# Patient Record
Sex: Female | Born: 2005 | Race: Black or African American | Hispanic: No | Marital: Single | Smoking: Never smoker
Health system: Southern US, Community
[De-identification: ages and names within clinical notes are randomized; demographics above are authoritative.]

---
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
## 2024-01-21 ENCOUNTER — Encounter: Payer: Self-pay | Admitting: Family Medicine

## 2024-01-21 ENCOUNTER — Ambulatory Visit

## 2024-05-08 ENCOUNTER — Emergency Department (HOSPITAL_COMMUNITY)

## 2024-05-08 ENCOUNTER — Encounter (HOSPITAL_COMMUNITY): Payer: Self-pay

## 2024-05-08 ENCOUNTER — Other Ambulatory Visit: Payer: Self-pay

## 2024-05-08 ENCOUNTER — Emergency Department (HOSPITAL_COMMUNITY)
Admission: EM | Admit: 2024-05-08 | Discharge: 2024-05-08 | Disposition: A | Discharge status: Home / Self Care | Attending: Emergency Medicine | Admitting: Emergency Medicine

## 2024-05-08 DIAGNOSIS — Y92410 Unspecified street and highway as the place of occurrence of the external cause: Secondary | ICD-10-CM | POA: Diagnosis not present

## 2024-05-08 DIAGNOSIS — M546 Pain in thoracic spine: Secondary | ICD-10-CM | POA: Insufficient documentation

## 2024-05-08 LAB — POC URINE PREG, ED: Preg Test, Ur: NEGATIVE

## 2024-05-08 MED ORDER — METHOCARBAMOL 500 MG PO TABS
500.0000 mg | ORAL_TABLET | Freq: Once | ORAL | Status: AC
  Administered 2024-05-08: 500 mg via ORAL
  Filled 2024-05-08: qty 1

## 2024-05-08 MED ORDER — ACETAMINOPHEN 500 MG PO TABS
1000.0000 mg | ORAL_TABLET | Freq: Once | ORAL | Status: AC
  Administered 2024-05-08: 1000 mg via ORAL
  Filled 2024-05-08: qty 2

## 2024-05-08 MED ORDER — KETOROLAC TROMETHAMINE 15 MG/ML IJ SOLN
15.0000 mg | Freq: Once | INTRAMUSCULAR | Status: AC
  Administered 2024-05-08: 15 mg via INTRAMUSCULAR
  Filled 2024-05-08: qty 1
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
## 2024-05-08 NOTE — ED Notes (Signed)
 Patient is having back pain and is concerned she hasn't had a x-ray. EDP notified.
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
## 2024-05-08 NOTE — ED Notes (Signed)
 Patient transported to CT

## 2024-06-10 ENCOUNTER — Ambulatory Visit
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
## 2024-06-12 ENCOUNTER — Ambulatory Visit: Admitting: Family Medicine

## 2024-06-12 VITALS — BP 106/66 | HR 95 | Temp 98.2°F | Ht 61.0 in | Wt 149.2 lb

## 2024-06-12 DIAGNOSIS — Z3201 Encounter for pregnancy test, result positive: Secondary | ICD-10-CM

## 2024-06-12 LAB — POCT URINE PREGNANCY: Preg Test, Ur: POSITIVE — AB

## 2024-06-12 MED ORDER — PRENATAL VITAMIN AND MINERAL 28-0.8 MG PO TABS: 1.0000 | ORAL_TABLET | Freq: Every day | ORAL | 3 refills | Status: DC

## 2024-06-25 ENCOUNTER — Telehealth: Payer: Self-pay | Admitting: Family Medicine

## 2024-06-25 ENCOUNTER — Other Ambulatory Visit: Payer: Self-pay | Admitting: Family Medicine

## 2024-06-25 DIAGNOSIS — Z3201 Encounter for pregnancy test, result positive: Secondary | ICD-10-CM

## 2024-06-25 NOTE — Telephone Encounter (Signed)
 Order needs to be OB less than 14 weeks, Transvaginal

## 2024-06-25 NOTE — Telephone Encounter (Signed)
 Melissa from [facility] [phone]  Needs a call back to change ultrasound. There is a crm about this.

## 2024-06-25 NOTE — Progress Notes (Signed)
 Updated order for ultrasound to be OB < 14 weeks.

## 2024-06-25 NOTE — Telephone Encounter (Signed)
 Spoke with PCP order has been changed.

## 2024-06-26 ENCOUNTER — Ambulatory Visit (HOSPITAL_COMMUNITY): Admission: RE | Admit: 2024-06-26 | Discharge: 2024-06-26 | Attending: Family Medicine

## 2024-06-26 DIAGNOSIS — Z3201 Encounter for pregnancy test, result positive: Secondary | ICD-10-CM | POA: Insufficient documentation

## 2024-06-30 ENCOUNTER — Ambulatory Visit: Payer: Self-pay | Admitting: Family Medicine

## 2024-07-21 NOTE — Progress Notes (Unsigned)
 New OB Intake  I connected with Natasha Bell  on 07/21/2024 at  8:15 AM EST by MyChart Video Visit and verified that I am speaking with the correct person using two identifiers. Nurse is located at [facility] and pt is located at home.  I discussed the limitations, risks, security and privacy concerns of performing an evaluation and management service by telephone and the availability of in person appointments. I also discussed with the patient that there may be a patient responsible charge related to this service. The patient expressed understanding and agreed to proceed.  I explained I am completing New OB Intake today. We discussed EDD of 02/03/2024 based on US  at 8.3 weeks. Pt is G1P0. I reviewed her allergies, medications and Medical/Surgical/OB history.    There are no active problems to display for this patient.    Concerns addressed today  Delivery Plans Plans to deliver at [facility]. Discussed the nature of our practice with multiple providers including residents and students as well as female and female providers. Due to the size of the practice, the delivering provider may not be the same as those providing prenatal care.   Patient {Is/is not:9024} interested in water birth.  MyChart/Babyscripts MyChart access verified. I explained pt will have some visits in office and some virtually. Babyscripts instructions given and order placed. Patient verifies receipt of registration text/e-mail. Account successfully created and app downloaded. If patient is a candidate for Optimized scheduling, add to sticky note.   Blood Pressure Cuff/Weight Scale Blood pressure cuff ordered for patient to pick-up from [employer]. Explained after first prenatal appt pt will check weekly and document in Babyscripts.  Anatomy US  Explained first scheduled US  will be around 19 weeks. Anatomy US  scheduled for *** at ***.  Is patient a CenteringPregnancy candidate?  {Accepted:19197::Accepted,Not  a Candidate,Declined} Declined due to {Declined:19197::Schedule,Childcare,Group setting,Support person concern,Declined to say,Enrolled in [facility],***} Not a candidate due to {Not a Candidate:19197::DM,CHTN, medication controlled,Language barrier,>28 weeks,Multiple gestation (mono-mono or mono-di),Complex coordination of care needed,***} If accepted,    Is patient a Mom+Baby Combined Care candidate?  {Accepted:19197::Accepted,Declined,Not a candidate,***}   If accepted, confirm patient does not intend to move from the area for at least 12 months, then notify Mom+Baby staff  Is patient a candidate for Babyscripts Optimization? {babyscripts:31704}   First visit review I reviewed new OB appt with patient. Explained pt will be seen by Jorene Moats, PA at first visit. Discussed [place] genetic screening with patient. [employer] and [employer].. Routine prenatal labs is needed at [facility] appointment.  Last Pap No results found for: DIAGPAP  Irene ONEIDA Lee, CMA 07/21/2024  4:53 PM

## 2024-07-22 ENCOUNTER — Telehealth

## 2024-07-22 DIAGNOSIS — Z3490 Encounter for supervision of normal pregnancy, unspecified, unspecified trimester: Secondary | ICD-10-CM | POA: Insufficient documentation

## 2024-07-22 DIAGNOSIS — Z3A12 12 weeks gestation of pregnancy: Secondary | ICD-10-CM

## 2024-07-22 DIAGNOSIS — Z3491 Encounter for supervision of normal pregnancy, unspecified, first trimester: Secondary | ICD-10-CM

## 2024-07-22 MED ORDER — BLOOD PRESSURE KIT DEVI: 1.0000 | 0 refills | Status: AC | PRN

## 2024-07-22 NOTE — Patient Instructions (Signed)
 Safe Medications in Pregnancy   Acne:  Benzoyl Peroxide  Salicylic Acid   Backache/Headache:  Tylenol: 2 regular strength every 4 hours OR               2 Extra strength every 6 hours   Colds/Coughs/Allergies:  Benadryl (alcohol free) 25 mg every 6 hours as needed  Breath right strips  Claritin  Cepacol throat lozenges  Chloraseptic throat spray  Cold-Eeze- up to three times per day  Cough drops, alcohol free  Flonase (by prescription only)  Guaifenesin  Mucinex  Robitussin DM (plain only, alcohol free)  Saline nasal spray/drops  Sudafed (pseudoephedrine) & Actifed * use only after [redacted] weeks gestation and if you do not have high blood pressure  Tylenol  Vicks Vaporub  Zinc lozenges  Zyrtec   Constipation:  Colace  Ducolax suppositories  Fleet enema  Glycerin suppositories  Metamucil  Milk of magnesia  Miralax  Senokot  Smooth move tea   Diarrhea:  Kaopectate  Imodium A-D   *NO pepto Bismol   Hemorrhoids:  Anusol  Anusol HC  Preparation H  Tucks   Indigestion:  Tums  Maalox  Mylanta  Zantac  Pepcid   Insomnia:  Benadryl (alcohol free) 25mg  every 6 hours as needed  Tylenol PM  Unisom, no Gelcaps   Leg Cramps:  Tums  MagGel   Nausea/Vomiting:  Bonine  Dramamine  Emetrol  Ginger extract  Sea bands  Meclizine  Nausea medication to take during pregnancy:  Unisom (doxylamine succinate 25 mg tablets) Take one tablet daily at bedtime. If symptoms are not adequately controlled, the dose can be increased to a maximum recommended dose of two tablets daily (1/2 tablet in the morning, 1/2 tablet mid-afternoon and one at bedtime).  Vitamin B6 100mg  tablets. Take one tablet twice a day (up to 200 mg per day).   Skin Rashes:  Aveeno products  Benadryl cream or 25mg  every 6 hours as needed  Calamine Lotion  1% cortisone cream   Yeast infection:  Gyne-lotrimin 7  Monistat 7    **If taking multiple medications, please check labels to avoid  duplicating the same active ingredients  **take medication as directed on the label  ** Do not exceed 4000 mg of tylenol in 24 hours  **Do not take medications that contain aspirin or ibuprofen           [facility] Pediatric Providers  [place] ([address]) [facility] Manson Passey, MD; Deirdre Priest, MD; Lum Babe, MD; Leveda Anna, MD; McDiarmid, MD; Jerene Bears, MD [address] [place], [place] [address] [phone] Mon-Fri 8:30-12:30, 1:30-5:00  Providers come to see babies during newborn hospitalization Only accepting infants of Mother's who are seen at [facility] or have siblings seen at   [facility] Medicaid - Yes; Tricare - Yes   [facility], MD [address] [place], [place] [address] [phone] Mon, Tue, Thur, Fri 8:30-5:00, Wed 10:00-7:00 (closed 1-2pm daily for lunch) [facility] residents with no insurance.  [place] [employer] only with Medicaid/insurance; Tricare - no  [facility]) - [facility], MD; Manson Passey, MD; Ave Filter, MD; Luna Fuse, MD; Kennedy Bucker, MD; Florestine Avers, MD; Melchor Amour, MD; Yetta Barre,  MD; Konrad Dolores, MD; Kathlene November, MD; Jenne Campus, MD; Wynetta Emery, MD; Duffy Rhody, MD; Gerre Couch, NP [address], [place], [place] [address] [phone] Mon, Tue, Thur, Fri 8:30-5:30, Wed 9:30-5:30, Sat 8:30-12:30 Only accepting infants of first-time parents or siblings of current patients  Hospital discharge coordinator will make follow-up appointment Medicaid - yes; Tricare - yes  [place] ([address]) [facility], MD; Earlene Plater, MD; Jamesetta Orleans, MD; Alvera Novel, MD; Rana Snare, MD; [facility], MD; [place], MD; Hosie Poisson, MD; Mayford Knife, MD [address], [place], [place] [address] [phone] Mon-Fri 8:30-5:00, closed for lunch 12:30-1:30; Sat-Sun 10:00-1:00 Accepting Newborns with commercial insurance only, must call prior to  delivery to be accepted into  practice.  Medicaid - no, Tricare - yes   [facility] [address] [place], [place] [address] [phone] or [phone] Mon to Fri 8am to 10pm, Sat 8am to 1pm (virtual only on weekends) Only accepts Medicaid Healthy Blue pts  [facility] ([facility]) - Pediatrics at Elige Radon, MD; Sabino Dick, MD; Quitman Livings, MD; Betha Loa, NP; Claretha Cooper, MD; Lelon Perla, MD [address] [place] [address] [place], [place] [address] [phone] Mon-Fri 8:30-5:30 Medicaid - yes, Tricare - yes  [place] [phone]) [facility] Mowers, MD [address], [place], [place] [address] [phone] Iona Hansen, Wed Fri 8:30-5:00, Sat 8:30-12:00, Closed Thursdays Accepting siblings of established patients and first time mom's if you call prenatally Medicaid- yes; Tricare - yes  [facility] Lutricia [place], [place]; Tracie Harrier, MD; Rusty Aus; [place], PA; Wynelle Link, MD; Azucena Cecil, MD;  [address] [place], [place] [address] [phone] Mon-Fri 8:30-5:00, closed for lunch 1-2 Only accepting newborns of established patients Medicaid- no; Tricare - yes  [facility] [phone]) [facility] Morene Crocker, MD; [address], [place], [place] [address] [phone] Mon-Fri 8:00-5:00 Medicaid - No; Tricare - Yes  [facility] at [facility], [place]; [place], [place] [address], [place], [place] [address] [phone] Mon-Fri 8:00-5:00 Medicaid - No, Tricare - Yes  [facility] Cardell Peach, MD; Nash Dimmer, MD; [place], [place] [address] [place], [place] [address] [phone]  Mon-Fri 8:00-5:00 Medicaid - No; Tricare - Yes  [facility] [address] [place], [place] [address] [phone] Mon-Fri 8:30-5:00 (lunch 12:00-1:00) Medicaid -Yes; Tricare - Yes  [facility] [place], MD [address] [place],  [place], [place] [address] [phone] Mon-Fri 8:00-5:00 Seeing newborns of current patients only. No new patients Medicaid - No, Tricare - yes  Nature conservation officer at [place] [address], MD [address] [place], [place] [address] [phone] Mon-Fri 8:00-5:00 Medicaid -yes as secondary coverage only; Tricare - yes  [facility] [place], [place]; [place], [place]; Avis Epley, MD; Vonna Kotyk, MD; Clance Boll, MD; [place], [place]; [place], NP; Vaughan Basta, MD; [place], MD [address] [place], [place] [address] [phone] Mon-Fri 8:30-5:00, Sat 9:00-11:00 Accepts commercial insurance ONLY. Offers free prenatal information sessions for families. Medicaid - No, Tricare - Call first  [facility], MD; [place], [place]; [place], [place]; [place], [place] [address] [place] [address] [phone] Mon-Fri 7:30-5:30 Medicaid - Yes; Ailene Rud yes  [place] [phone] & [phone])  [facility], MD [address] [place], [place] [address] [phone] Mon-Thur 8:00-6:00, closed for lunch 12-2, closed Fridays Medicaid - yes; Tricare - no  [facility] Dareen Piano, NP; Cyndia Bent, MD; [place], [place]; [place], [place] [address], [place], [place] [address] [phone] Mon-Fri 7:30-4:30 Medicaid - yes, Tricare - yes  [facility]  Juanito Doom, MD; Janene Harvey, NP; Vonita Moss, MD; Donn Pierini, NP [address] [place] [address], [place], [place] [address] [phone] Mon-Fri 8:30-5:00, closed for lunch 1-2, Sat 8:30-12:00 - sick visits only Providers come to see babies  at [facility] Only accepting newborns of siblings and first time parents ONLY if who have met with office prior to delivery Medicaid -Yes; Tricare - yes  [facility] - [place], [place]; Spero Geralds, NP; Earlene Plater, MD; Lucretia Roers, MD:  [address], [place], [place] [address] [phone] Mon- Fri 8:00-5:00, Sat 9:00-12:00 - sick  visits only Accepting siblings of established patients and first time mom/baby Medicaid - Yes; Tricare - yes Patients must have vaccinations (baby vaccines)  [place] [phone] & [phone])  Adult nurse HealthCare at [facility] [address] [place], [place] [address] [phone] Mon-Fri 8:00-5:00 Medicaid - no; Tricare - yes  [facility] [place], MD; [place], [place]; [place], [place] [address], [place], [place] [address] [phone] Mon-Fri 8:00-5:00 Medicaid- yes; Tricare - yes  [facility] - Ardeen Jourdain, MD; Yetta Barre, NP; [place], [place] [address] [place], [place], [place] [address] [phone] Mon-Fri 8:00-5:00 Medicaid - Yes; Tricare - yes  [address] [place] [phone])  [facility] [place], [place]; [place], [place]; Eddie Candle, MD; Normand Sloop, MD; [place], NP; [place], DO; [place], [place]; Constance Goltz, MD; Ruthann Cancer, MD; Vear Clock, MD; [place], [place]; [place], [place]; [place], [place] [address] [facility] [address], [place], [place] [address] [phone] Mon-Fri 8:30-5:00, Sat 9:00-12:00 - sick only Please register online [email or web address] then schedule online or call office Medicaid-Yes; Tricare -yes  [facility] - Premier  [place], MD; Romualdo Bolk, MD; [place], MD; [place], NP; [place], [place]; Antonietta Barcelona, MD; Mayford Knife, NP; Shelva Majestic, MD [address], [place], [place] [address] [phone] Mon-Fri 8:00-5:30, Sat&Sun by appointment (phones open at 8:30) Medicaid - Yes; Tricare - yes  [place] [phone] & [phone]) [facility] Mariel Aloe; [place], MD; Roger Shelter, MD; Arvilla Market, NP; [place], DO [address], [place], [place] [address] [phone] M-F 8:00 - 5:15, Sat/Sun 9-12 sick visits only Medicaid - No; Tricare - yes  [facility]  [place], PA-C; [place], PA-C; [place], DO; [place], PA-C; [place], PA-C; Roselyn Bering, MD [address] [place], [place] [address] [phone] Mon-Thur 8:00-7:00, Fri 8:00-5:00 Accepting Medicaid for 13 and under only   [facility] - Family Medicine at [place] [facility] [place]) [place], [place]; List, FNP; Berneda Rose, MD; Luther Redo, PA-C; Lavonia Drafts, MD; Kellie Simmering, FNP; Genevie Cheshire, FNP; Evaristo Bury, MD; Berneda Rose, MD [phone] [address] [place], [place] [address] [phone] Mon-Fri 8:30-5:30 Medicaid - Yes; Tricare - yes  [facility] - [address]  [place], ; Whitney Post, MD; Hennie Duos, MD; Wynne Dust, MD; [place], NP [address], [place], [place] [address] [phone] Mon-Thur 8:00-5:30, Fri 8:00-5:00, Sat 9:00-12:00 Medicaid - yes, Tricare - yes  [place] [phone])  [facility], [place]; Lenise Arena, MD; [place], [place] [address], [place], [place] [address] [phone] Mon-Fri 8:00-5:00, closed for lunch 12-1 Medicaid - No; Tricare - yes  Nature conservation officer at [facility], MD [address] [place], [place] [address] [phone] Mon-Fri 8:00-5:00 Medicaid - No; Tricare - yes  [facility], MD; Tami Ribas, MD; Mariam Dollar, MD; Yetta Barre, MD [address] [facility] [address], [place], [place] [address] [phone] Mon-Fri 8:00-5:00 Medicaid- Yes; Tricare - yes  [place] [phone])  Adult nurse HealthCare at [facility], [place]; [place], MD [address] [place], [place], [place] [address] [phone]  Mon-Fri 8:00-5:00 Medicaid - No; Tricare - yes  [facility] - [facility] - CPNP [address] [place], [place] [address] [phone] Mon-Weds 8:00-6:00, Thurs-Fri 8:00-5:00, Sat 9:00-12:00 Medicaid - yes; Tricare - yes   [facility] Katharina Caper, MD; [place], [place] [address] [place], [place] [address] [phone] Mon-Fri 8:00-5:00 Medicaid - yes; Tricare - yes  [facility]  Pediatric Providers  [facility] [address], [place], [place] [address] [phone] Sheral Flow: 8am -8pm, Tues, Weds: 8am - 5pm; Fri: 8-1 Medicaid - Yes; Tricare - yes  [facility] Rachel Bo, MD; Laural Benes, MD; Anner Crete, MD; [place], [place]; [place], [place] [address] [place], [place] [address] [phone] M-F 8:30 - 5:00 Medicaid - Call office; Tricare -yes  [facility] Edson Snowball, MD; Shanon Rosser, MD, Chelsea Primus, MD; Shirlyn Goltz, PNP; Wardell Heath, NP [phone] [address] [place], [place] [address] [phone] M-F 8:30 - 5:00, Sat/Sun 8:30 - 12:30 (sick visits) Medicaid - Call office; Tricare -yes  [facility] Melvyn Neth, MD; Karl Luke, PNP; Princess Bruins, MD; [place], [place]; [place], NP; Cynda Familia [address], [place], [place] [address] [phone] M-F 8:30 - 5:00 Medicaid - Call office; Tricare - yes  [facility] Jesusita Oka, MD; Dierdre Highman, MD; Earnest Conroy, MD; Timothy Lasso, MD; [place], MD [phone] [address] [place], [place] [address] [phone] M-Thur: 8:00 - 5:00; Fri: 8:00 - 4:00 Medicaid - yes; Tricare - yes  [facility] [address] [place], [place] [address] [phone] M-F: 8:30- 5:00, closed for lunch 12:30 - 1:00 Medicaid - yes; Tricare -yes  [facility] [address] [place], [place] [address] [phone] M-F 8:00 - 5:00 Medicaid - yes; Tricare - yes  [facility] [place], DO; [place], DO; [place], NP [address], [place], [place] [address] [phone] M-F 8:00 - 5:00, Closed 12-1 for lunch Medicaid - Call; Tricare - yes  [facility] Meredith Mody, MD [address], [place], [place] [address] [phone] M-F: 8:00-5:00, Sat: 8:00 - noon Medicaid - call; Tricare -yes  [facility] Pediatric Providers  [facility] [place], [place] [address] [place], [place], [place] [address] [phone] M-W: 8:00-5:00, Thur: 8:00 -  7:00, Fri: 8:00 - noon Medicaid - yes; Tricare - yes  [facility] - Quay Burow, FNP [address], [place], [place] [address] [phone] M-F 8:00 - 5:00, Closed for lunch 12-1 Medicaid - yes; Tricare - yes  [facility] Pediatric Providers  [facility] Primary Care at [place], [place], Alinda Money, MD, [place], FNP-C [address] [facility], [address], [place], [place] [address] [phone] M-T 8:00-5:00, Wed-Fri 7:00-6:00 Medicaid - Yes; Tricare -yes  [facility] Family Medicine at [facility], DO; [address] [place], [place] [address] [phone] M-F 8:00 - 5:00, closed for lunch 12-1 Medicaid - Yes; Tricare - yes  [facility] Pediatrics and Internal Medicine  Zachery Dauer, MD; Gladstone Lighter, MD; Collie Siad, MD; Freda Jackson, MD; Rich Number, MD; Darryl Nestle, MD; Melinda Crutch, MD, Audria Nine, MD; Tawanna Cooler, MD; Steffanie Dunn, MD; Byrd Hesselbach, MD; Lucretia Roers, MD [address], [place], [place] [address] [phone] M-F 8:00-5:00 Medicaid - yes; Tricare - yes  [facility] [place], MD (speaks [place] and Hindi) [address] [place], [place] [address] [phone] M-F: 8:30 - 5:00, closed 12:30 - 1 for lunch Medicaid - Yes; Tricare -yes  [facility] Pediatric Providers  [facility] Shanda Bumps, MD; Chanetta Marshall, MD; Laurell Josephs, MD  [address] [place], [place] [address] [phone] M-Th: 8:00 - 5:30, Fri: 8:00 - 12:00 Medicaid - yes; Tricare - yes  [facility]s at [facility], NP; Thora Lance, MD; Orrin Brigham, MD [phone] [address] [place], [place] [address] [phone] M-F: 8:00 - 5:00 Medicaid - yes; Tricare - yes  [facility] Hopkins, NP; Orson Slick, NP; Salley Scarlet, NP; Linton Flemings, MD; Mayford Knife, MD, [place]ikes, NP, Emelda Fear, MD; Nida Boatman [address] [place], [place] [address] [phone] M-F: 8:30 - 5:30p Medicaid - yes; Tricare - yes Other locations available as well  [facility], MD; Andrey Campanile, MD; Neville Route, PA-C [address] [place], [place] [address] [phone] M-W: 8:00am - 7:00pm, Thurs: 8:00am - 8:00pm; Fri: 8:00am - 5:00pm, closed daily from 12-1 for lunch Medicaid - yes; Tricare - yes  [facility] Pediatric Providers  [facility] at Levin Erp, MD; Aggie Cosier, FNP; Bland Span, MD; Tristan Schroeder, MD; [place], PNP; Alesia Banda; [place], [place]; Julian Reil, MD;  [address] [place], [place] [address] [phone] Judie Petit - Caleen Essex: 8am - 5pm, Sat 9-noon Medicaid - Yes; Tricare -yes  [facility] at Jaclynn Guarneri, MD; Yetta Barre, FNP; Lilian Kapur, MD; Mariam Dollar, MD [address] [place] Schlatter, UX[address]355 [phone] M-F 8:00 - 5:00 Medicaid - call; Tricare - yes  [facility]- Cruz Condon, MD; [place], [place]; Delora Fuel, MD; Dareen Piano, MD; Trudee Grip, MD; Kizzie Ide, MD; Zebedee Iba; Birdena Crandall, MD; Hinton Dyer, MD; [place], MD [address] [place], [place] [address] [phone] M-F 8:00am - 5:00pm; Sat. 9:00 - 11:00 Medicaid - yes; Tricare - yes  [facility] at [facility], MD [address] [place], [place] [address] [phone] M-F 8:00 - 5:00 Medicaid - [place] Medicaid only; Tricare - yes  [facility] - Illene Bolus, MD; Earlene Plater, [place]; Kenyon Ana, MD [address] [place], [place] [address] [phone] M-F 8:00 - 5:00 Medicaid - yes; Tricare - yes  [facility] [address] Pediatrics - Lind Covert, MD; Manson Passey, MD, [facility], MD, [place], MD; [place], MD; Katrinka Blazing, MD; [address] [place], [place] [address] [phone] M-F: 8-5 Medicaid - yes; Tricare - yes  [facility] - Henrietta Hoover, [place]; [place], MD; [address] [place], [place] [address] [phone] M-F 8-5 Medicaid - yes; Tricare - yes  [address] [place] Darrol Poke, MD; Tami Ribas, MD; [place], MD; [place], DNP;  [place], PNP [address], [place], [place] [address] [phone] M-F 8-5 Medicaid - yes; Tricare - yes  [facility] and Pediatrics Delories Heinz, MD; Adrienne Mocha; Ala Bent, MD [address], [place], [place] [address] [phone] M-F 7am - 5 pm Medicaid - call; Tricare - yes  [facility] [place], [place]; Fredia Beets, MD; Roxan Hockey, MD [address] [place], [place] [address] [phone] M-F 8-5 Medicaid - yes; Tricare - yes  [facility] Kae Heller, MD; Sheliah Hatch, MD; Mayford Knife, FNP; Shon Baton, FNP; Tyron Russell, FNP; Ishmael Holter; [facility] - FNP [address] [place], [place] [address] [phone] M-F 8-5 Medicaid- yes; Tricare - yes  [facility]s - Betsy Coder, [place] and [place] Guest, MD; Terrial Rhodes, MD; Hulda Humphrey, MD; Roseanne Reno, MD; [place], ; Ala Dach, MD; Fredia Beets, MD; Dimple Casey, MD [address], [place], [place] [address] [phone] M-F: 8-5, Sat: 9-4, Sun 9-12 Medicaid - yes; Tricare - yes  [facility] [place], PNP; [place], PNP [address] [place], [place], [place]  [address] [phone] M-F 8 - 5, closed 12-1 for lunch Medicaid - yes; Tricare - yes  [facility] Kathyrn Lass, MD; Hal Neer, MD; Dimple Casey, MD; [place], DO [address] [place], [place] [address] [phone] M-F 8- 5:30 Medicaid - yes; Tricare - yes  Darnelle [facility] - Biagio Quint, MD; Rosalia Hammers, MD; Gwenith Daily, MD [address] [place], [place] [address] [phone] Judie Petit: Nicholas Lose; Tues-Fri: 8-5; Sat: 9-12 Medicaid - yes; Tricare - yes  Darnelle [facility] - Bobbye [place], MD; Daphane Shepherd, MD; Chestine Spore, MD; Haskell Riling, MD; Kate Sable, MD [address], [place], [place] [address] [phone] Judie PetitMarland Kitchen Nicholas LoseFrancee Nodal: 8-5; Sat: 8:30-12:30 Medicaid - yes;  Tricare - yes  [facility]lena Heckle [facility] Pediatrics - Beckey Rutter, MD; [place], [place] [address], [place], [place] [address] [phone] Mon-Fri: 8-5 Medicaid - yes; Tricare - yes  [facility]arnelle [facility] - [place], CPNP; [place], [place]; Dimple Casey, MD; Alisa Graff, MD; Cephus Shelling, MD; [address] [place], [place] [address] [phone] M-F: 8-5, closed 1-2 for lunch Medicaid - yes; Tricare - yes  [facility] - [facility] [place], [place]; [place], [place]; Katrinka Blazing, MD; [place], CPNP; [place], [place]; [place], MD; Earlene Plater, MD [address], [place], [place] [address] [phone] M-Thurs: Nicholas Lose; Fri: 8-6; Sat: 9-12; Sun 2-4 Medicaid - yes; Tricare - yes  Darnelle Bos [facility] [address] [facility] Georgeanna Lea, MD; Evette Cristal, MD; Shea Stakes, FNP; Earney Mallet, DO; [address] [place], [place] [address] [phone] M-F: 8-5 Medicaid - yes; Tricare - yes  [facility] Pediatric Providers  [facility] Medicine -Collene Mares, MD; [place], NP [address] [place], [place] [address] [phone] M - Fri: 8am - 5pm, closed for lunch 12-1 Medicaid - Yes; Tricare - yes  [facility] Elinor Parkinson, MD; Victory Dakin, MD; [place]anger, DO; Vinocur, MD;Hall, PA; Clent Ridges, [place]; Orvan Falconer, NP [phone] [address] [place], [place] [address] [phone] M-F 8:00 - 5:00, Sat 8:00 - 11:30 Medicaid - yes; Tricare - yes  [facility] Welton Flakes, MD; [place], MD, [address] MD, [place], MD, Malone, MD; [place], NP; [place], [place];  [address], [place], [place] [address] [phone] M-F 8:10am - 5:00pm Medicaid - yes; Tricare - yes  [facility] Eustaquio Boyden, MD; [place], NP [address] [place], [place] [address] [phone] M-F 8:00 - 5:00 Medicaid - [place] Medicaid only; Tricare - yes  [facility] [address] Bonadelle Ranchos, MD; [place], NP [address], [place], [place] [address] [phone] M-F 8:00 - 5:00; Closed for lunch 12 - 1:00 Medicaid - yes; Tricare - yes  [facility] Belva Crome, MD; Jonita Albee, FNP [address] [place], [place] [address] [phone] Mon 9-5; Tues/Wed 10-5; Thurs 8:30-5; Fri: 8-12:30 Medicaid - yes; Tricare - yes  [facility] Pediatric Providers  [facility]  Stamps, MD; [place], [place] [address] [place], [place] [address] [phone] phone [phone] fax M-F 7:15 - 4:30 Medicaid - yes; Tricare - yes  [place] [facility] Karilyn Cota, MD; [place], DO [address], [place], [place] [address] [phone] M-Fri: 8:30 - 5:00, closed for lunch everyday noon - 1pm Medicaid - Yes; Tricare - yes  [facility] Burdine, MD; Reuel Boom, MD; Dimas Aguas, MD; Neita Carp, MD; [place], [place]; Bonnita Nasuti, [place]; [place], [place]; [place], [place];  [place], PA [address] [place], [place] [address] [phone] M-Thurs: 7:30am - 7:00pm; Friday 7:30am - 4pm; Sat: 8:00 - 1:00 Medicaid - Yes; Tricare - yes  [place] [facility] Morton, MD; Conni Elliot, MD; Carroll Kinds, MD; [place], DO [address], [place], [place] [address] [phone] M-Thur: 8:00 - 5:00, Fri: 8:00 - Noon Medicaid - yes; Tricare - yes No [place] [employer]  [place] [facility] [place], MD; Nadine Counts, DO; [place], NP; Daphine Deutscher, NP; Lequita Halt, NP; Ellamae Sia, NP; Reginia Forts, NP; Darlyn Read, MD; [place], [place] [address] [place], [place] [address] [phone] M-F 8:00 - 5:00 Medicaid - yes; Tricare - yes  [facility], FNP-C; [place], FNP-C [address] [place], [place] [address] [phone] M, W, R 8:00-5:00, Tues: 8:00am - 7:00pm; Fri 8:00 - noon Medicaid - Yes; Tricare - yes  [facility], MD [address] [place], [place] [address] [phone]  M-Thurs 8:30-5:30, Fri: 8:30-12:30pm Medicaid - Yes; Tricare - N    Considering Waterbirth? Guide for patients at Center for [employer] [facility]) Why consider waterbirth? Gentle birth for babies  Less pain medicine used in labor  May allow for passive descent/less pushing  May reduce perineal tears  More mobility and instinctive maternal position changes  Increased maternal relaxation   Is waterbirth safe? What are the risks of infection, drowning or other complications? Infection:  Very low risk (3.7 % for tub vs 4.8% for bed)  7 in 8000 waterbirths with documented infection  Poorly cleaned equipment most common cause  Slightly lower group B strep transmission rate  Drowning  Maternal:  Very low risk  Related to seizures or fainting  Newborn:  Very low risk. No evidence of increased risk of respiratory problems in multiple large studies  Physiological protection from breathing under water  Avoid underwater birth if there are any fetal complications  Once baby's head is out of the water, keep it out.  Birth complication  Some reports of cord trauma, but risk decreased by bringing baby to surface gradually  No evidence of increased risk of shoulder dystocia. Mothers can usually change positions faster in water than in a bed, possibly aiding the maneuvers to free the shoulder.   There are 2 things you MUST do to have a waterbirth with [facility]: Attend a waterbirth class at [facility] at [facility]   3rd Wednesday of every month from 7-9 pm (virtual during COVID) [employer] at [email or web address] or [email or web address] or by calling [phone] Bring [place] the certificate from the class to your prenatal appointment or send via MyChart Meet with a midwife at 36 weeks* to see if you can still plan a waterbirth and to sign the consent.   *We also recommend that you schedule as many of your prenatal  visits with a midwife as possible.    Helpful information: You may want to bring a bathing suit top to the hospital to wear during labor but this is optional.  All other supplies are provided by the hospital. Please arrive at the hospital with signs of active labor, and do not wait at home until late in labor. It takes 45 min- 1 hour for fetal monitoring, and check in to your room to take place, plus transport and filling of the waterbirth tub.    Things that would prevent you from having a waterbirth: Premature, <37wks  Previous  cesarean birth  Presence of thick meconium-stained fluid  Multiple gestation (Twins, triplets, etc.)  Uncontrolled diabetes or gestational diabetes requiring medication  Hypertension diagnosed in pregnancy or preexisting hypertension (gestational hypertension, preeclampsia, or chronic hypertension) Fetal growth restriction (your baby measures less than 10th percentile on ultrasound) Heavy vaginal bleeding  Non-reassuring fetal heart rate  Active infection (MRSA, etc.). Group B Strep is NOT a contraindication for waterbirth.  If your labor has to be induced and induction method requires continuous monitoring of the baby's heart rate  Other risks/issues identified by your obstetrical provider   Please remember that birth is unpredictable. Under certain unforeseeable circumstances your provider may advise against giving birth in the tub. These decisions will be made on a case-by-case basis and with the safety of you and your baby as our highest priority.         CenteringPregnancy is a model of prenatal care that started 30 years ago and is used in about 600 practices around the [place]. You meet with a group of 8-12 women due around the same time as you. In Centering you will have individual time with the provider and meet as a group. There's much more time for discussion and learning. You will actually have much more time with your provider in Centering than in  traditional prenatal care.? You will come directly into the Centering room and will not wait in the lobby so there is no wasted time. You will have 2-hour visits every 4 weeks then every 2 weeks. You will know your Centering prenatal appointments in advance. In your last month of pregnancy, you may also come in for some individual visits. Additional appointments can be scheduled if you need more care. Studies have shown that CenteringPregnancy improves birth outcomes. We have seen especially big improvements in fewer Black women delivering babies who are too small or born too early. Visit the website CenteringHealthcare for more information. Let your provider or clinic staff know if you want to sign up or email [email or web address] for more information.   CenteringPregnancy Video

## 2024-07-29 ENCOUNTER — Encounter: Payer: Self-pay | Admitting: *Deleted

## 2024-07-31 ENCOUNTER — Other Ambulatory Visit (HOSPITAL_COMMUNITY)
Admission: RE | Admit: 2024-07-31 | Discharge: 2024-07-31 | Disposition: A | Source: Ambulatory Visit | Attending: Physician Assistant | Admitting: Physician Assistant

## 2024-07-31 ENCOUNTER — Ambulatory Visit: Payer: Self-pay | Admitting: Physician Assistant

## 2024-07-31 ENCOUNTER — Other Ambulatory Visit: Payer: Self-pay

## 2024-07-31 VITALS — BP 124/64 | HR 91 | Wt 151.0 lb

## 2024-07-31 DIAGNOSIS — Z3A13 13 weeks gestation of pregnancy: Secondary | ICD-10-CM | POA: Diagnosis not present

## 2024-07-31 DIAGNOSIS — R112 Nausea with vomiting, unspecified: Secondary | ICD-10-CM | POA: Diagnosis not present

## 2024-07-31 DIAGNOSIS — Z3491 Encounter for supervision of normal pregnancy, unspecified, first trimester: Secondary | ICD-10-CM | POA: Insufficient documentation

## 2024-07-31 MED ORDER — ASPIRIN 81 MG PO TBEC: 81.0000 mg | DELAYED_RELEASE_TABLET | Freq: Every day | ORAL | 12 refills | Status: AC

## 2024-07-31 MED ORDER — DOXYLAMINE-PYRIDOXINE 10-10 MG PO TBEC: 2.0000 | DELAYED_RELEASE_TABLET | Freq: Every day | ORAL | 5 refills | Status: AC

## 2024-07-31 MED ORDER — PRENATAL VITAMIN AND MINERAL 28-0.8 MG PO TABS: 1.0000 | ORAL_TABLET | Freq: Every day | ORAL | 3 refills | Status: AC

## 2024-07-31 NOTE — Patient Instructions (Signed)
 Safe Over-The-Counter Medications in Pregnancy   Acne:  Benzoyl Peroxide  Salicylic Acid   Backache/Headache:  Tylenol : 2 regular strength every 4 hours OR               2 Extra strength every 6 hours   Colds/Coughs/Allergies:  Benadryl  (alcohol free) 25 mg every 6 hours as needed  Breath right strips  Claritin  Cepacol throat lozenges  Chloraseptic throat spray  Cold-Eeze- up to three times per day  Cough drops, alcohol free  Flonase (by prescription only)  Guaifenesin  Mucinex  Robitussin DM (plain only, alcohol free)  Saline nasal spray/drops  Sudafed (pseudoephedrine) & Actifed * use only after [redacted] weeks gestation and if you do not have high blood pressure  Tylenol   Vicks Vaporub  Zinc lozenges  Zyrtec   Constipation:  Colace  Ducolax suppositories  Fleet enema  Glycerin  suppositories  Metamucil  Milk of magnesia  Miralax  Senokot  Smooth move tea   Diarrhea:  Kaopectate  Imodium A-D   *NO pepto Bismol*  Hemorrhoids:  Anusol  Anusol HC  Preparation H  Tucks   Indigestion:  Tums  Maalox  Mylanta  Zantac  Pepcid   Insomnia:  Benadryl  (alcohol free) 25mg  every 6 hours as needed  Tylenol  PM  Unisom, no Gelcaps   Leg Cramps:  Tums  MagGel   Nausea/Vomiting:  Bonine  Dramamine  Emetrol  Ginger extract  Sea bands  Meclizine  Nausea medication to take during pregnancy:  Unisom (doxylamine succinate 25 mg tablets) Take one tablet daily at bedtime. If symptoms are not adequately controlled, the dose can be increased to a maximum recommended dose of two tablets daily (1/2 tablet in the morning, 1/2 tablet mid-afternoon and one at bedtime).  Vitamin B6 100mg  tablets. Take one tablet twice a day (up to 200 mg per day).   Skin Rashes:  Aveeno products  Benadryl  cream or 25mg  every 6 hours as needed  Calamine Lotion  1% cortisone cream   Yeast infection:  Gyne-lotrimin 7  Monistat 7   **If taking multiple medications, please check labels to  avoid duplicating the same active ingredients  **take medication as directed on the label  ** Do not exceed 4000 mg of tylenol  in 24 hours  **Do not take medications that contain aspirin or ibuprofen      While you are pregnant, there are some foods you should not eat or eat only in small amounts:  Certain types of cooked fish--While you're pregnant, do not eat bigeye tuna, king mackerel, marlin, orange roughy, shark, swordfish, or tilefish. Limit white (albacore) tuna to only 6 ounces a week. These fish may have high levels of mercury, which can be harmful during pregnancy. All other types of cooked fish are safe and healthy for you and your pregnancy. Try to eat at least two servings of fish or shellfish per week.  Caffeine --Caffeine  is found in coffee, tea, chocolate, energy drinks, and soft drinks. It's a good idea to limit your daily intake of caffeine  to less than 200 milligrams, which is the amount in one 12-ounce cup of coffee. Limiting caffeine  can help with nausea and sleep problems.  Sushi--Raw fish may be harmful during pregnancy. Cooked sushi is fine.  Unpasteurized milk and cheese--These foods can cause a disease called listeriosis. Avoid cheeses that are made with raw milk (such as some feta, queso fresco, and blue cheeses). Hot dogs and lunch meats can also cause this disease, although it's rare. To be on  the safe side, only eat hot dogs and lunch meats that have been heated until steaming hot.

## 2024-07-31 NOTE — Progress Notes (Signed)
 "   PRENATAL VISIT NOTE  Subjective:  Natasha Bell is a 19 y.o. G1P0 at [redacted]w[redacted]d being seen today for her first prenatal visit for this pregnancy.  She is currently monitored for the following issues for this low-risk pregnancy and has Supervision of low-risk pregnancy on their problem list.  Patient reports no complaints.    Vag. Bleeding: None.  Contractions: none. Denies leaking of fluid.   She is planning to breastfeed. Desires unsure for contraception.   The following portions of the patient's history were reviewed and updated as appropriate: allergies, current medications, past family history, past medical history, past social history, past surgical history and problem list.   Objective:   Vitals:   07/31/24 1517  BP: 124/64  Pulse: 91  Weight: 151 lb (68.5 kg)    Fetal Status: Fetal Heart Rate (bpm): 164         General:  Alert, oriented and cooperative. Patient is in no acute distress.  Skin: Skin is warm and dry. No rash noted.   Cardiovascular: Normal heart rate and rhythm noted  Respiratory: Normal respiratory effort, no problems with respiration noted. Clear to auscultation.   Abdomen: Soft, gravid, appropriate for gestational age. Normal bowel sounds. Non-tender. Pain/Pressure: Present     Pelvic: Cervical exam deferred       Normal cervical contour, no lesions, no bleeding following pap, normal discharge  Extremities: Normal range of motion.     Mental Status: Normal mood and affect. Normal behavior. Normal judgment and thought content.    Indications for ASA therapy (per uptodate) One of the following: Previous pregnancy with preeclampsia, especially early onset and with an adverse outcome No Multifetal gestation No Chronic hypertension No Type 1 or 2 diabetes mellitus No Chronic kidney disease No Autoimmune disease (antiphospholipid syndrome, systemic lupus erythematosus) No  Two or more of the following: Nulliparity Yes Obesity (body mass index  >30 kg/m2) No Family history of preeclampsia in mother or sister No Age >=35 years No Sociodemographic characteristics (African American race, low socioeconomic level) Yes Personal risk factors (eg, previous pregnancy with low birth weight or small for gestational age infant, previous adverse pregnancy outcome [eg, stillbirth], interval >10 years between pregnancies) No  Assessment and Plan:  Pregnancy: G1P0 at [redacted]w[redacted]d  1. Encounter for supervision of low-risk pregnancy in first trimester (Primary) Initial labs drawn. Continue prenatal vitamins. Genetic Screening discussed: NIPS, carrier screening and AFP  Ultrasound discussed; fetal anatomic survey: 09/23/24 Problem list reviewed and updated. Reviewed Brx optimized schedule, patient agreeable The nature of [facility] with multiple MDs and other Advanced Practice Providers was explained to patient; also emphasized that residents, students are part of our team. Routine obstetric precautions reviewed.   2. [redacted] weeks gestation of pregnancy Anticipatory guidance about next visits/weeks of pregnancy given.    Preterm labor/first trimester warning symptoms and general obstetric precautions including but not limited to vaginal bleeding, contractions, leaking of fluid and fetal movement were reviewed in detail with the patient.  Please refer to After Visit Summary for other counseling recommendations.   Return in about 4 weeks (around 08/28/2024) for LOB.  Future Appointments  Date Time Provider Department Center  08/15/2024  9:00 AM CENTERING PROVIDER [facility]  09/12/2024  9:00 AM CENTERING PROVIDER [facility]  09/23/2024  8:00 AM [facility] PROVIDER 1 [facility]  09/23/2024  8:30 AM [facility] US1 [facility]  10/10/2024  9:00 AM CENTERING PROVIDER [facility]  11/07/2024  9:00 AM  CENTERING PROVIDER [facility]  11/21/2024  9:00 AM CENTERING PROVIDER [facility]  12/19/2024  9:00 AM CENTERING PROVIDER [facility]   01/02/2025  9:00 AM CENTERING PROVIDER [facility]  01/16/2025  9:00 AM CENTERING PROVIDER [facility]    Jorene FORBES Moats, PA-C  "

## 2024-08-01 LAB — CBC/D/PLT+RPR+RH+ABO+RUBIGG...
Antibody Screen: NEGATIVE
Basophils Absolute: 0 x10E3/uL (ref 0.0–0.2)
Basos: 0 %
EOS (ABSOLUTE): 0 x10E3/uL (ref 0.0–0.4)
Eos: 0 %
HCV Ab: NONREACTIVE
HIV Screen 4th Generation wRfx: NONREACTIVE
Hematocrit: 34.2 % (ref 34.0–46.6)
Hemoglobin: 11.3 g/dL (ref 11.1–15.9)
Hepatitis B Surface Ag: NEGATIVE
Immature Grans (Abs): 0 x10E3/uL (ref 0.0–0.1)
Immature Granulocytes: 0 %
Lymphocytes Absolute: 1.7 x10E3/uL (ref 0.7–3.1)
Lymphs: 17 %
MCH: 33.5 pg — ABNORMAL HIGH (ref 26.6–33.0)
MCHC: 33 g/dL (ref 31.5–35.7)
MCV: 102 fL — ABNORMAL HIGH (ref 79–97)
Monocytes Absolute: 0.5 x10E3/uL (ref 0.1–0.9)
Monocytes: 5 %
Neutrophils Absolute: 8.1 x10E3/uL — ABNORMAL HIGH (ref 1.4–7.0)
Neutrophils: 78 %
Platelets: 407 x10E3/uL (ref 150–450)
RBC: 3.37 x10E6/uL — ABNORMAL LOW (ref 3.77–5.28)
RDW: 11.3 % — ABNORMAL LOW (ref 11.7–15.4)
RPR Ser Ql: NONREACTIVE
Rh Factor: NEGATIVE
Rubella Antibodies, IGG: 2.61 {index}
WBC: 10.4 x10E3/uL (ref 3.4–10.8)

## 2024-08-01 LAB — HEMOGLOBIN A1C
Est. average glucose Bld gHb Est-mCnc: 74 mg/dL
Hgb A1c MFr Bld: 4.2 % — ABNORMAL LOW (ref 4.8–5.6)

## 2024-08-01 LAB — HCV INTERPRETATION

## 2024-08-04 LAB — GC/CHLAMYDIA PROBE AMP (~~LOC~~) NOT AT ARMC
Chlamydia: NEGATIVE
Comment: NEGATIVE
Comment: NORMAL
Neisseria Gonorrhea: NEGATIVE

## 2024-08-09 LAB — PANORAMA PRENATAL TEST FULL PANEL:PANORAMA TEST PLUS 5 ADDITIONAL MICRODELETIONS: FETAL FRACTION: 7.4

## 2024-08-09 LAB — HORIZON CUSTOM: REPORT SUMMARY: NEGATIVE

## 2024-08-14 ENCOUNTER — Encounter: Payer: Self-pay | Admitting: Physician Assistant

## 2024-08-14 ENCOUNTER — Ambulatory Visit: Payer: Self-pay | Admitting: Physician Assistant

## 2024-08-14 DIAGNOSIS — Z6791 Unspecified blood type, Rh negative: Secondary | ICD-10-CM | POA: Insufficient documentation

## 2024-08-15 ENCOUNTER — Encounter: Payer: Self-pay | Admitting: Family

## 2024-09-23 ENCOUNTER — Other Ambulatory Visit

## 2024-09-23 ENCOUNTER — Ambulatory Visit
